# Patient Record
Sex: Female | Born: 1970
Health system: Southern US, Community
[De-identification: ages and names within clinical notes are randomized; demographics above are authoritative.]

## PROBLEM LIST (undated history)

## (undated) DIAGNOSIS — G43909 Migraine, unspecified, not intractable, without status migrainosus: Secondary | ICD-10-CM

## (undated) DIAGNOSIS — K219 Gastro-esophageal reflux disease without esophagitis: Secondary | ICD-10-CM

---

## 1998-05-11 ENCOUNTER — Other Ambulatory Visit: Admission: RE | Admit: 1998-05-11 | Discharge: 1998-05-11 | Payer: Self-pay | Admitting: *Deleted

## 1999-05-17 ENCOUNTER — Other Ambulatory Visit: Admission: RE | Admit: 1999-05-17 | Discharge: 1999-05-17 | Payer: Self-pay | Admitting: *Deleted

## 2000-05-25 ENCOUNTER — Other Ambulatory Visit: Admission: RE | Admit: 2000-05-25 | Discharge: 2000-05-25 | Payer: Self-pay | Admitting: Obstetrics and Gynecology

## 2001-05-24 ENCOUNTER — Other Ambulatory Visit: Admission: RE | Admit: 2001-05-24 | Discharge: 2001-05-24 | Payer: Self-pay | Admitting: Obstetrics and Gynecology

## 2010-08-17 ENCOUNTER — Ambulatory Visit: Payer: Self-pay | Admitting: Psychology

## 2015-09-17 ENCOUNTER — Emergency Department (HOSPITAL_BASED_OUTPATIENT_CLINIC_OR_DEPARTMENT_OTHER)
Admission: EM | Admit: 2015-09-17 | Discharge: 2015-09-17 | Disposition: A | Discharge status: Home / Self Care | Payer: Worker's Compensation | Attending: Emergency Medicine | Admitting: Emergency Medicine

## 2015-09-17 ENCOUNTER — Emergency Department (HOSPITAL_BASED_OUTPATIENT_CLINIC_OR_DEPARTMENT_OTHER): Payer: Worker's Compensation

## 2015-09-17 ENCOUNTER — Encounter (HOSPITAL_BASED_OUTPATIENT_CLINIC_OR_DEPARTMENT_OTHER): Payer: Self-pay | Admitting: *Deleted

## 2015-09-17 DIAGNOSIS — Y929 Unspecified place or not applicable: Secondary | ICD-10-CM | POA: Diagnosis not present

## 2015-09-17 DIAGNOSIS — S9031XA Contusion of right foot, initial encounter: Secondary | ICD-10-CM

## 2015-09-17 DIAGNOSIS — S99921A Unspecified injury of right foot, initial encounter: Secondary | ICD-10-CM | POA: Diagnosis present

## 2015-09-17 DIAGNOSIS — Y999 Unspecified external cause status: Secondary | ICD-10-CM | POA: Insufficient documentation

## 2015-09-17 DIAGNOSIS — Y9389 Activity, other specified: Secondary | ICD-10-CM | POA: Insufficient documentation

## 2015-09-17 DIAGNOSIS — W208XXA Other cause of strike by thrown, projected or falling object, initial encounter: Secondary | ICD-10-CM | POA: Diagnosis not present

## 2015-09-17 DIAGNOSIS — S92911A Unspecified fracture of right toe(s), initial encounter for closed fracture: Secondary | ICD-10-CM

## 2015-09-17 DIAGNOSIS — Z79899 Other long term (current) drug therapy: Secondary | ICD-10-CM | POA: Diagnosis not present

## 2015-09-17 DIAGNOSIS — S92511A Displaced fracture of proximal phalanx of right lesser toe(s), initial encounter for closed fracture: Secondary | ICD-10-CM | POA: Diagnosis not present

## 2015-09-17 HISTORY — DX: Migraine, unspecified, not intractable, without status migrainosus: G43.909

## 2015-09-17 HISTORY — DX: Gastro-esophageal reflux disease without esophagitis: K21.9

## 2015-09-17 MED ORDER — IBUPROFEN 600 MG PO TABS: 600.0000 mg | ORAL_TABLET | Freq: Four times a day (QID) | ORAL | Status: AC | PRN

## 2015-09-17 MED ORDER — IBUPROFEN 800 MG PO TABS
800.0000 mg | ORAL_TABLET | Freq: Once | ORAL | Status: AC | PRN
  Administered 2015-09-17: 800 mg via ORAL
  Filled 2015-09-17: qty 1

## 2015-09-17 MED ORDER — HYDROCODONE-ACETAMINOPHEN 5-325 MG PO TABS: 1.0000 | ORAL_TABLET | ORAL | Status: AC | PRN

## 2015-09-17 MED FILL — HYDROCODON-APAP 5-325: 5-325 | 2 days supply | Qty: 10 | Fill #0

## 2015-09-17 MED FILL — IBUPROFEN 600 MG TABLET: 600 | 7 days supply | Qty: 30 | Fill #0

## 2015-09-17 NOTE — ED Notes (Signed)
Quartz counter fell on her right foot.

## 2015-09-17 NOTE — ED Provider Notes (Signed)
CSN: [id number]     Arrival date & time 09/17/15  1425 History   First MD Initiated Contact with Patient 09/17/15 1454     Chief Complaint  Patient presents with  . Foot Injury   PT IS A 45 YO WF WHO WAS MOVING A QUARTZ COUNTER WITH A CO-WORKER.  THE COUNTER TOP FELL ON PT'S FOOT.  PT HAS BEEN UNABLE TO WALK ON IT SINCE THE INJURY.  NO OTHER INJURY.  PT GIVEN IBUPROFEN IN TRIAGE.  SHE DOES NOT WANT ANYTHING ELSE RIGHT NOW FOR PAIN.  (Consider location/radiation/quality/duration/timing/severity/associated sxs/prior Treatment) Patient is a 45 y.o. female presenting with foot injury. The history is provided by the patient.  Foot Injury Location:  Foot Injury: no   Foot location:  R foot Pain details:    Quality:  Sharp   Radiates to:  Does not radiate   Severity:  Moderate   Onset quality:  Sudden   Timing:  Constant   Progression:  Unchanged Chronicity:  New Dislocation: no   Foreign body present:  No foreign bodies Prior injury to area:  No   Past Medical History  Diagnosis Date  . GERD (gastroesophageal reflux disease)   . Migraine    Past Surgical History  Procedure Laterality Date  . Cesarean section     No family history on file. Social History  Substance Use Topics  . Smoking status: Never Smoker   . Smokeless tobacco: None  . Alcohol Use: Yes     Comment: weekly   OB History    No data available     Review of Systems  Musculoskeletal:       RIGHT FOOT PAIN  All other systems reviewed and are negative.     Allergies  Review of patient's allergies indicates no known allergies.  Home Medications   Prior to Admission medications   Medication Sig Start Date End Date Taking? Authorizing Provider  Famotidine (PEPCID PO) Take by mouth.   Yes Historical Provider, MD  HYDROcodone-acetaminophen (NORCO/VICODIN) 5-325 MG tablet Take 1 tablet by mouth every 4 (four) hours as needed. 09/17/15   Jacalyn LefevreJulie Kesley Mullens, MD  ibuprofen (ADVIL,MOTRIN) 600 MG tablet Take 1  tablet (600 mg total) by mouth every 6 (six) hours as needed. 09/17/15   Jacalyn LefevreJulie Shirlee Whitmire, MD   Pulse 93  Temp(Src) 98.4 F (36.9 C) (Oral)  Resp 20  Ht 5\' 3"  (1.6 m)  Wt 168 lb (76.204 kg)  BMI 29.77 kg/m2  SpO2 99% Physical Exam  Constitutional: She is oriented to person, place, and time. She appears well-developed and well-nourished.  HENT:  Head: Normocephalic and atraumatic.  Right Ear: External ear normal.  Left Ear: External ear normal.  Nose: Nose normal.  Mouth/Throat: Oropharynx is clear and moist.  Eyes: Conjunctivae and EOM are normal. Pupils are equal, round, and reactive to light.  Neck: Normal range of motion. Neck supple.  Cardiovascular: Normal rate, regular rhythm, normal heart sounds and intact distal pulses.   Pulmonary/Chest: Effort normal and breath sounds normal.  Abdominal: Soft. Bowel sounds are normal.  Musculoskeletal:       Feet:  Neurological: She is alert and oriented to person, place, and time.  Skin: Skin is warm and dry.  Psychiatric: She has a normal mood and affect. Her behavior is normal. Judgment and thought content normal.  Nursing note and vitals reviewed.   ED Course  Procedures (including critical care time) Labs Review Labs Reviewed - No data to display  Imaging Review Dg  Foot Complete Right  09/17/2015  CLINICAL DATA:  Acute right foot pain after blunt trauma. EXAM: RIGHT FOOT COMPLETE - 3+ VIEW COMPARISON:  None. FINDINGS: Cortical regularity is seen involving the third and fourth proximal phalanges suggesting nondisplaced fractures. Joint spaces are intact. No radiopaque foreign body is noted. IMPRESSION: Possible nondisplaced fractures involving the third and fourth proximal phalanges. Electronically Signed   By: Lupita RaiderJames  Green Jr, M.D.   On: 09/17/2015 15:00   I have personally reviewed and evaluated these images and lab results as part of my medical decision-making.   EKG Interpretation None      MDM  PT WILL BE PLACED IN A  POST LEG SPLINT WITH CRUTCHES.  SHE IS GIVEN THE NUMBER TO [facility]TH[facility].  SHE IS GIVEN A RX FOR IBUPROFEN 600 AND LORTAB (10).  SHE KNOWS TO RETURN IF WORSE. Final diagnoses:  Foot contusion, right, initial encounter  Closed fracture of proximal phalanx of toe of right foot      Jacalyn LefevreJulie Janele Lague, MD 09/17/15 1521

## 2015-09-17 NOTE — ED Notes (Signed)
Patient transported to X-ray 

## 2015-11-08 ENCOUNTER — Ambulatory Visit: Payer: Self-pay | Admitting: Physical Therapy

## 2017-12-24 IMAGING — CR DG FOOT COMPLETE 3+V*R*
3 series · 3 of 3 positions shown · non-contrast
Comparison: None.

CLINICAL DATA: Acute right foot pain after blunt trauma.

EXAM:
RIGHT FOOT COMPLETE - 3+ VIEW

[t foot ap right]
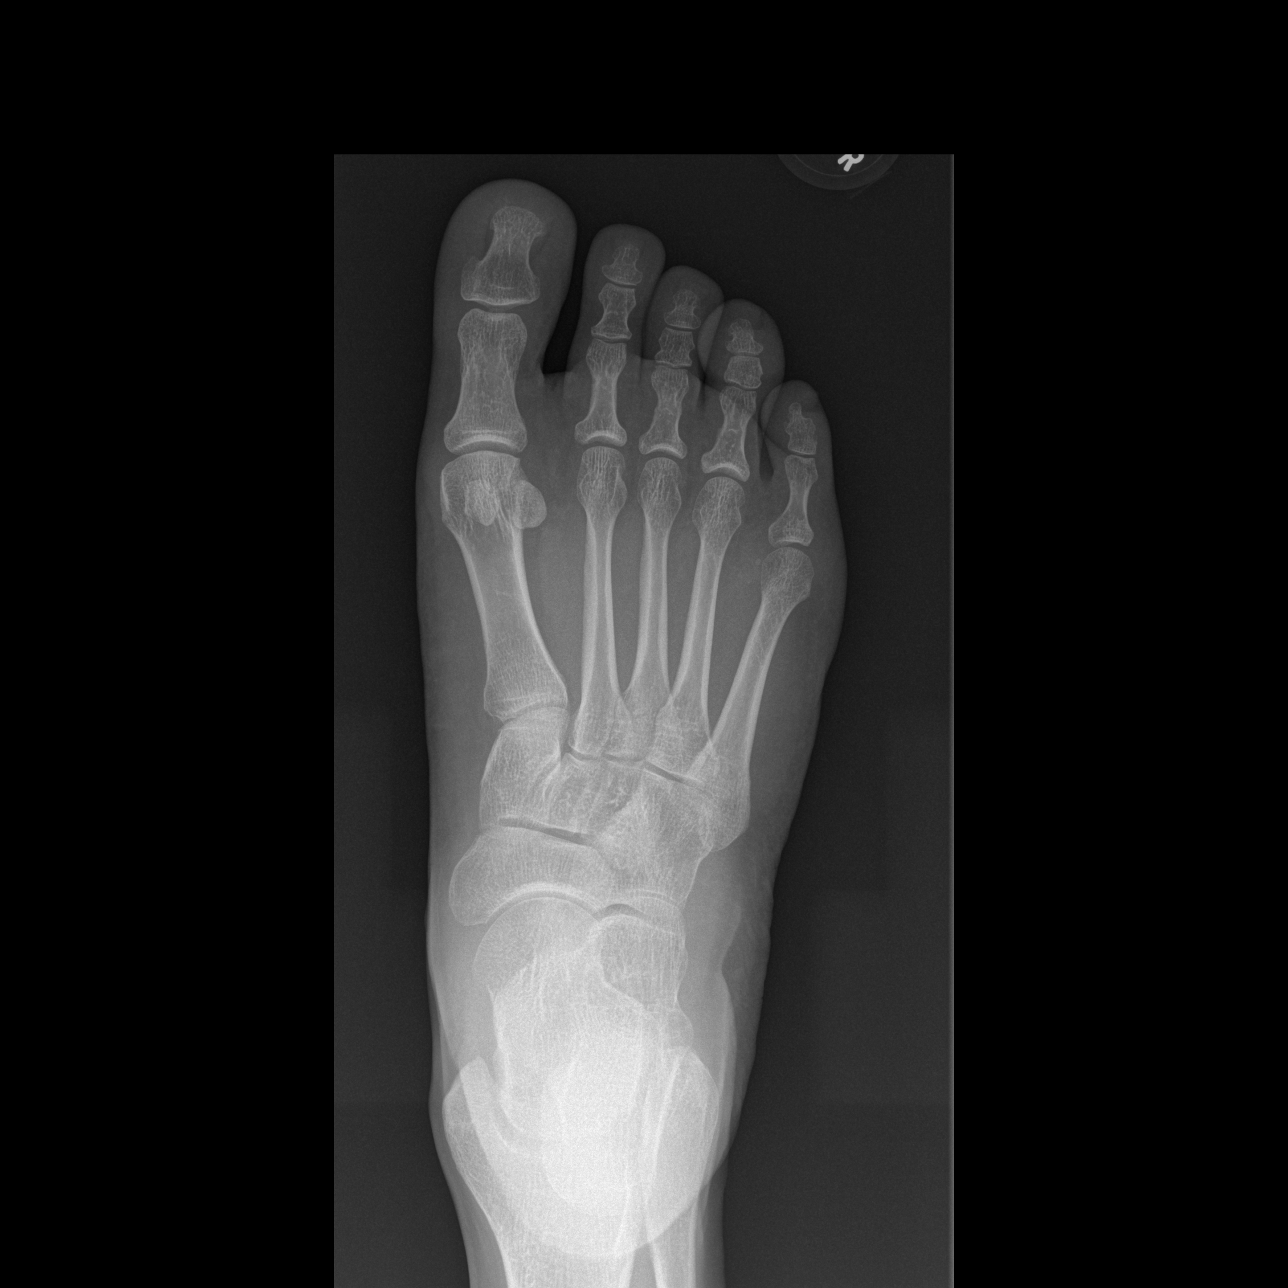

[t foot oblique right]
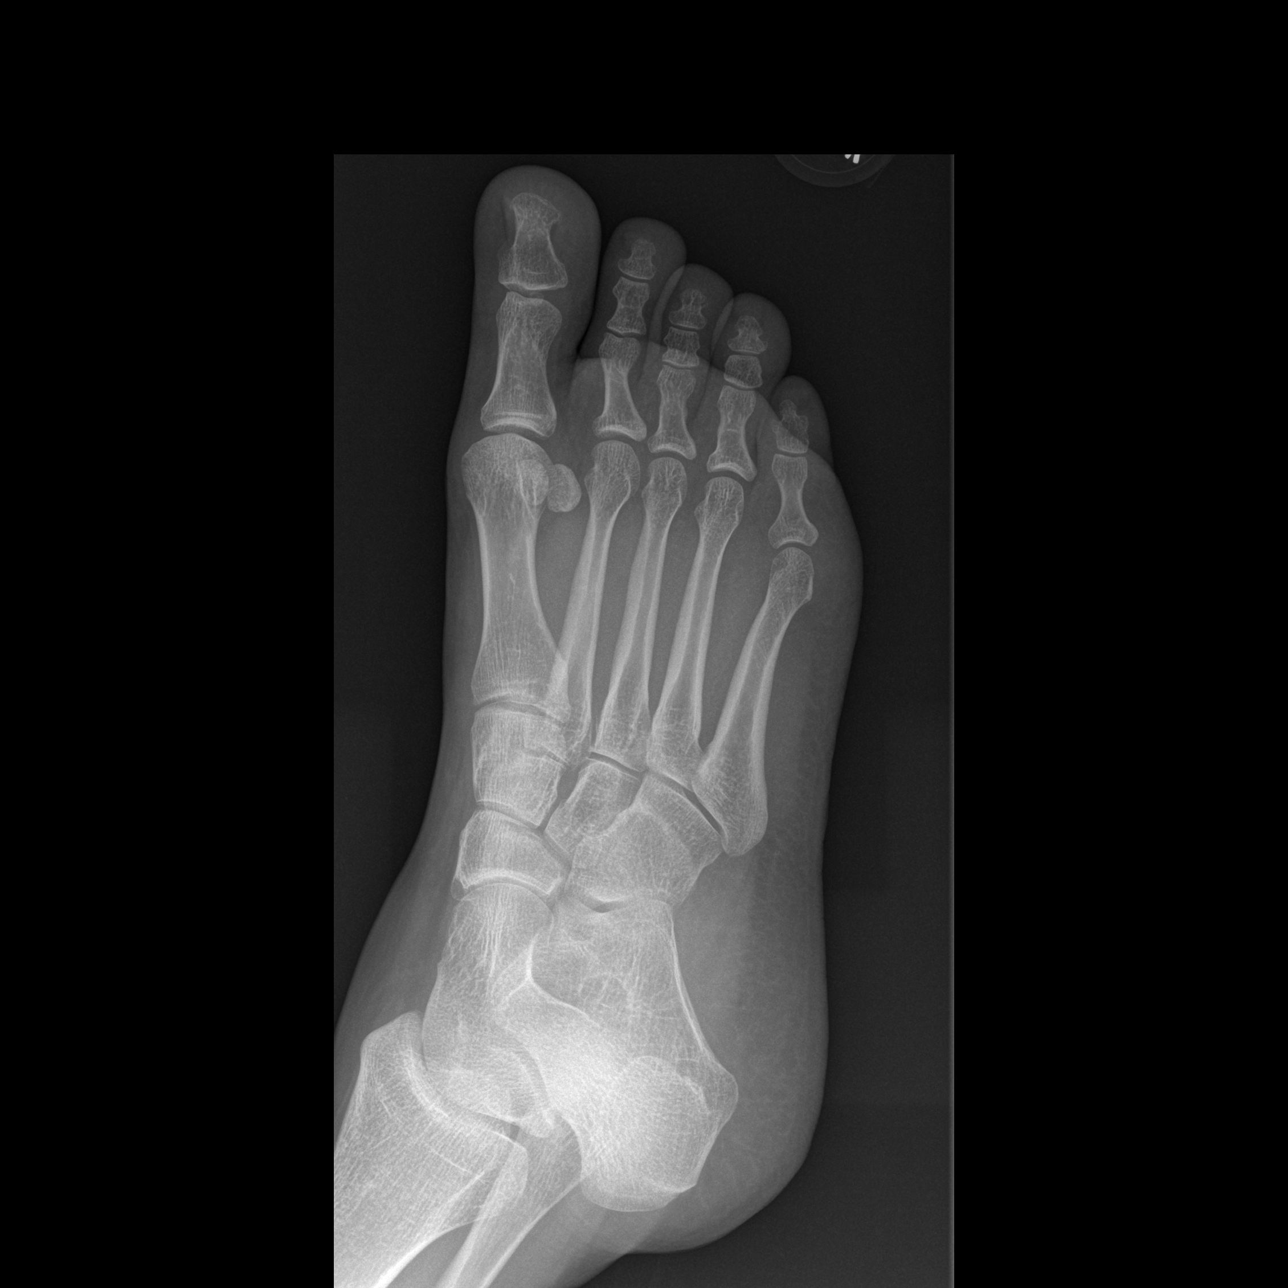

[t foot lat right]
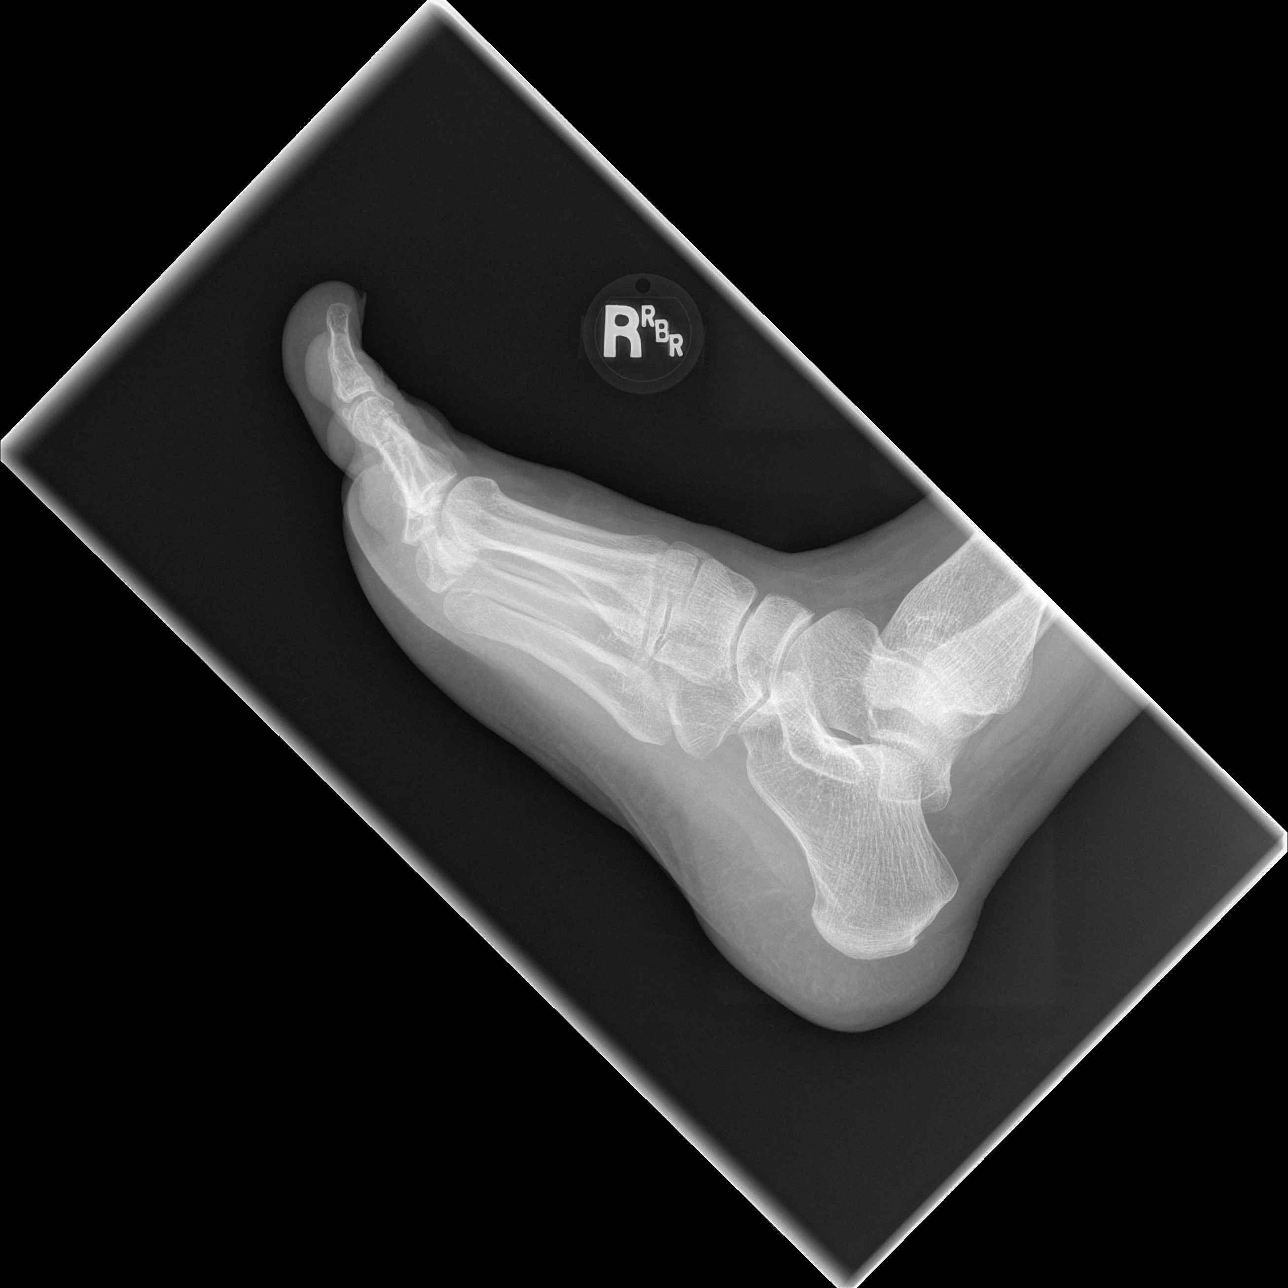

[3 of 3 positions shown; findings below may reference images not displayed]

FINDINGS: Cortical regularity is seen involving the third and fourth proximal
phalanges suggesting nondisplaced fractures. Joint spaces are
intact. No radiopaque foreign body is noted.
IMPRESSION: Possible nondisplaced fractures involving the third and fourth
proximal phalanges.
